# Patient Record
Sex: Male | Born: 1975 | Marital: Single | Smoking: Never smoker
Health system: Southern US, Community
[De-identification: ages and names within clinical notes are randomized; demographics above are authoritative.]

## PROBLEM LIST (undated history)

## (undated) DIAGNOSIS — I10 Essential (primary) hypertension: Secondary | ICD-10-CM

## (undated) DIAGNOSIS — K2900 Acute gastritis without bleeding: Secondary | ICD-10-CM

## (undated) DIAGNOSIS — K219 Gastro-esophageal reflux disease without esophagitis: Secondary | ICD-10-CM

## (undated) DIAGNOSIS — Z87442 Personal history of urinary calculi: Secondary | ICD-10-CM

## (undated) DIAGNOSIS — M5481 Occipital neuralgia: Secondary | ICD-10-CM

## (undated) HISTORY — PX: HIP FRACTURE SURGERY: SHX118

## (undated) HISTORY — PX: CHOLECYSTECTOMY: SHX55

---

## 2004-01-21 HISTORY — PX: CYSTOSCOPY WITH HOLMIUM LASER LITHOTRIPSY: SHX6639

## 2020-11-07 ENCOUNTER — Ambulatory Visit
Admission: RE | Admit: 2020-11-07 | Discharge: 2020-11-07 | Disposition: A | Discharge status: Home / Self Care | Payer: 59 | Attending: Internal Medicine | Admitting: Internal Medicine

## 2020-11-07 ENCOUNTER — Ambulatory Visit: Payer: 59 | Admitting: Anesthesiology

## 2020-11-07 ENCOUNTER — Other Ambulatory Visit: Payer: Self-pay

## 2020-11-07 ENCOUNTER — Encounter: Payer: Self-pay | Admitting: Internal Medicine

## 2020-11-07 ENCOUNTER — Encounter
Admission: RE | Disposition: A | Discharge status: Home / Self Care | Payer: Self-pay | Source: Home / Self Care | Attending: Internal Medicine

## 2020-11-07 DIAGNOSIS — Z1211 Encounter for screening for malignant neoplasm of colon: Secondary | ICD-10-CM | POA: Diagnosis present

## 2020-11-07 DIAGNOSIS — K2270 Barrett's esophagus without dysplasia: Secondary | ICD-10-CM | POA: Insufficient documentation

## 2020-11-07 DIAGNOSIS — K449 Diaphragmatic hernia without obstruction or gangrene: Secondary | ICD-10-CM | POA: Insufficient documentation

## 2020-11-07 DIAGNOSIS — K2289 Other specified disease of esophagus: Secondary | ICD-10-CM | POA: Insufficient documentation

## 2020-11-07 DIAGNOSIS — K219 Gastro-esophageal reflux disease without esophagitis: Secondary | ICD-10-CM | POA: Diagnosis not present

## 2020-11-07 DIAGNOSIS — K2970 Gastritis, unspecified, without bleeding: Secondary | ICD-10-CM | POA: Diagnosis not present

## 2020-11-07 HISTORY — PX: COLONOSCOPY WITH PROPOFOL: SHX5780

## 2020-11-07 HISTORY — DX: Personal history of urinary calculi: Z87.442

## 2020-11-07 HISTORY — PX: ESOPHAGOGASTRODUODENOSCOPY (EGD) WITH PROPOFOL: SHX5813

## 2020-11-07 SURGERY — ESOPHAGOGASTRODUODENOSCOPY (EGD) WITH PROPOFOL
Anesthesia: General

## 2020-11-07 MED ORDER — PROPOFOL 10 MG/ML IV BOLUS
INTRAVENOUS | Status: DC | PRN
  Administered 2020-11-07: 20 mg via INTRAVENOUS
  Administered 2020-11-07: 10 mg via INTRAVENOUS
  Administered 2020-11-07: 20 mg via INTRAVENOUS
  Administered 2020-11-07: 40 mg via INTRAVENOUS

## 2020-11-07 MED ORDER — PROPOFOL 500 MG/50ML IV EMUL
INTRAVENOUS | Status: DC | PRN
  Administered 2020-11-07: 200 ug/kg/min via INTRAVENOUS

## 2020-11-07 MED ORDER — LIDOCAINE HCL (CARDIAC) PF 100 MG/5ML IV SOSY
PREFILLED_SYRINGE | INTRAVENOUS | Status: DC | PRN
  Administered 2020-11-07: 100 mg via INTRAVENOUS

## 2020-11-07 MED ORDER — SODIUM CHLORIDE 0.9 % IV SOLN: INTRAVENOUS | Status: DC

## 2020-11-07 NOTE — Op Note (Signed)
[facility] Gastroenterology Patient Name: Johnathan Massey Procedure Date: 11/07/2020 1:35 PM MRN: [id number] Account #: [id number] Date of Birth: 05/12/1975 Admit Type: Outpatient Age: 45 Room: [facility] ENDO ROOM 2 Gender: Male Note Status: Finalized Instrument Name: Upper Endoscope [id number] Procedure:             Upper GI endoscopy Indications:           Suspected esophageal reflux, Failure to respond to                         medical treatment Providers:             Boykin Nearing. Norma Fredrickson MD, MD Referring MD:          No Local Md, MD (Referring MD) Medicines:             Propofol per Anesthesia Complications:         No immediate complications. Procedure:             Pre-Anesthesia Assessment:                        - The risks and benefits of the procedure and the                         sedation options and risks were discussed with the                         patient. All questions were answered and informed                         consent was obtained.                        - Patient identification and proposed procedure were                         verified prior to the procedure by the nurse. The                         procedure was verified in the procedure room.                        - ASA Grade Assessment: III - A patient with severe                         systemic disease.                        - After reviewing the risks and benefits, the patient                         was deemed in satisfactory condition to undergo the                         procedure.                        After obtaining informed consent, the endoscope was  passed under direct vision. Throughout the procedure,                         the patient's blood pressure, pulse, and oxygen                         saturations were monitored continuously. The Endoscope                         was introduced through the mouth, and advanced to the                          third part of duodenum. The upper GI endoscopy was                         accomplished without difficulty. The patient tolerated                         the procedure well. Findings:      Three tongues of salmon-colored mucosa were present. No other visible       abnormalities were present. The maximum longitudinal extent of these       esophageal mucosal changes was 2 cm in length. Mucosa was biopsied with       a cold forceps for histology. One specimen bottle was sent to pathology.      No other significant abnormalities were identified in a careful       examination of the esophagus.      Two biopsies were obtained in the upper third of the esophagus with cold       forceps for histology.      A 3 cm hiatal hernia was present.      Localized mild inflammation characterized by erythema was found in the       prepyloric region of the stomach.      The examined duodenum was normal.      The exam was otherwise without abnormality. Impression:            - Salmon-colored mucosa suspicious for short-segment                         Barrett's esophagus. Biopsied.                        - 3 cm hiatal hernia.                        - Gastritis.                        - Normal examined duodenum.                        - The examination was otherwise normal.                        - Two biopsies were obtained in the upper third of the                         esophagus. Recommendation:        - Await pathology results.                        -  Proceed with colonoscopy Procedure Code(s):     --- Professional ---                        [phone], Esophagogastroduodenoscopy, flexible,                         transoral; with biopsy, single or multiple Diagnosis Code(s):     --- Professional ---                        K29.70, Gastritis, unspecified, without bleeding                        K44.9, Diaphragmatic hernia without obstruction or                         gangrene                         K22.8, Other specified diseases of esophagus CPT copyright 2019 American Medical Association. All rights reserved. The codes documented in this report are preliminary and upon coder review may  be revised to meet current compliance requirements. Stanton Kidney MD, MD 11/07/2020 2:03:11 PM This report has been signed electronically. Number of Addenda: 0 Note Initiated On: 11/07/2020 1:35 PM Estimated Blood Loss:  Estimated blood loss: none.      [facility]

## 2020-11-07 NOTE — Anesthesia Postprocedure Evaluation (Signed)
Anesthesia Post Note  Patient: Johnathan Massey  Procedure(s) Performed: ESOPHAGOGASTRODUODENOSCOPY (EGD) WITH PROPOFOL COLONOSCOPY WITH PROPOFOL  Patient location during evaluation: Phase II Anesthesia Type: General Level of consciousness: awake and alert, awake and oriented Pain management: pain level controlled Vital Signs Assessment: post-procedure vital signs reviewed and stable Respiratory status: spontaneous breathing, nonlabored ventilation and respiratory function stable Cardiovascular status: blood pressure returned to baseline and stable Postop Assessment: no apparent nausea or vomiting Anesthetic complications: no   No notable events documented.   Last Vitals:  Vitals:   11/07/20 1420 11/07/20 1440  BP: 113/86 (!) 121/98  Pulse: 93   Resp: (!) 22   Temp:    SpO2: 96%     Last Pain:  Vitals:   11/07/20 1450  TempSrc:   PainSc: 0-No pain                 Manfred Arch

## 2020-11-07 NOTE — Op Note (Signed)
[facility] Gastroenterology Patient Name: Johnathan Massey Procedure Date: 11/07/2020 1:35 PM MRN: [id number] Account #: [id number] Date of Birth: 09/02/1975 Admit Type: Outpatient Age: 45 Room: [facility] ENDO ROOM 2 Gender: Male Note Status: Finalized Instrument Name: Nelda Marseille [id number] Procedure:             Colonoscopy Indications:           Screening for colorectal malignant neoplasm Providers:             Royce Macadamia K. Norma Fredrickson MD, MD Referring MD:          No Local Md, MD (Referring MD) Medicines:             Propofol per Anesthesia Complications:         No immediate complications. Procedure:             Pre-Anesthesia Assessment:                        - The risks and benefits of the procedure and the                         sedation options and risks were discussed with the                         patient. All questions were answered and informed                         consent was obtained.                        - Patient identification and proposed procedure were                         verified prior to the procedure by the nurse. The                         procedure was verified in the procedure room.                        - ASA Grade Assessment: II - A patient with mild                         systemic disease.                        - After reviewing the risks and benefits, the patient                         was deemed in satisfactory condition to undergo the                         procedure.                        After obtaining informed consent, the colonoscope was                         passed under direct vision. Throughout the procedure,  the patient's blood pressure, pulse, and oxygen                         saturations were monitored continuously. The                         Colonoscope was introduced through the anus and                         advanced to the the cecum, identified by appendiceal                          orifice and ileocecal valve. The colonoscopy was                         performed without difficulty. The patient tolerated                         the procedure well. The quality of the bowel                         preparation was adequate. The ileocecal valve,                         appendiceal orifice, and rectum were photographed. Findings:      The perianal and digital rectal examinations were normal.      The entire examined colon appeared normal on direct and retroflexion       views. Impression:            - The entire examined colon is normal on direct and                         retroflexion views.                        - No specimens collected. Recommendation:        - Patient has a contact number available for                         emergencies. The signs and symptoms of potential                         delayed complications were discussed with the patient.                         Return to normal activities tomorrow. Written                         discharge instructions were provided to the patient.                        - Await pathology results from EGD, also performed                         today.                        - Resume previous diet.                        -  Continue present medications.                        - Repeat colonoscopy in 10 years for screening                         purposes.                        - Return to physician assistant in 3 months.                        - The findings and recommendations were discussed with                         the patient.                        - Follow up with Jacob Moores, PA-C in the GI office.                         [phone] Procedure Code(s):     --- Professional ---                        N9892, Colorectal cancer screening; colonoscopy on                         individual not meeting criteria for high risk Diagnosis Code(s):     --- Professional ---                        Z12.11,  Encounter for screening for malignant neoplasm                         of colon CPT copyright 2019 American Medical Association. All rights reserved. The codes documented in this report are preliminary and upon coder review may  be revised to meet current compliance requirements. Stanton Kidney MD, MD 11/07/2020 2:23:17 PM This report has been signed electronically. Number of Addenda: 0 Note Initiated On: 11/07/2020 1:35 PM Scope Withdrawal Time: 0 hours 6 minutes 56 seconds  Total Procedure Duration: 0 hours 9 minutes 1 second  Estimated Blood Loss:  Estimated blood loss: none.      [facility]

## 2020-11-07 NOTE — Transfer of Care (Signed)
Immediate Anesthesia Transfer of Care Note  Patient: Johnathan Massey  Procedure(s) Performed: ESOPHAGOGASTRODUODENOSCOPY (EGD) WITH PROPOFOL COLONOSCOPY WITH PROPOFOL  Patient Location: PACU  Anesthesia Type:General  Level of Consciousness: sedated  Airway & Oxygen Therapy: Patient Spontanous Breathing  Post-op Assessment: Report given to RN  Post vital signs: stable  Last Vitals:  Vitals Value Taken Time  BP 113/86 11/07/20 1420  Temp    Pulse 94 11/07/20 1420  Resp 21 11/07/20 1420  SpO2 96 % 11/07/20 1420  Vitals shown include unvalidated device data.  Last Pain:  Vitals:   11/07/20 1420  TempSrc:   PainSc: Asleep         Complications: No notable events documented.

## 2020-11-07 NOTE — Anesthesia Preprocedure Evaluation (Signed)
Anesthesia Evaluation  Patient identified by MRN, date of birth, ID band Patient awake    Reviewed: Allergy & Precautions, NPO status , Patient's Chart, lab work & pertinent test results, reviewed documented beta blocker date and time   Airway Mallampati: II  TM Distance: >3 FB Neck ROM: Full    Dental no notable dental hx.    Pulmonary neg pulmonary ROS,    Pulmonary exam normal        Cardiovascular hypertension, Pt. on medications and Pt. on home beta blockers Normal cardiovascular exam     Neuro/Psych negative neurological ROS  negative psych ROS   GI/Hepatic Neg liver ROS, Bowel prep,GERD  Medicated,  Endo/Other  negative endocrine ROS  Renal/GU negative Renal ROS  negative genitourinary   Musculoskeletal negative musculoskeletal ROS (+)   Abdominal   Peds negative pediatric ROS (+)  Hematology negative hematology ROS (+)   Anesthesia Other Findings . Essential hypertension  . Gastroesophageal reflux disease without esophagitis     Reproductive/Obstetrics negative OB ROS                             Anesthesia Physical Anesthesia Plan  ASA: 2  Anesthesia Plan: General   Post-op Pain Management:    Induction: Intravenous  PONV Risk Score and Plan: 2 and Propofol infusion and TIVA  Airway Management Planned: Natural Airway and Nasal Cannula  Additional Equipment:   Intra-op Plan:   Post-operative Plan:   Informed Consent: I have reviewed the patients History and Physical, chart, labs and discussed the procedure including the risks, benefits and alternatives for the proposed anesthesia with the patient or authorized representative who has indicated his/her understanding and acceptance.       Plan Discussed with: CRNA, Anesthesiologist and Surgeon  Anesthesia Plan Comments:         Anesthesia Quick Evaluation

## 2020-11-08 ENCOUNTER — Encounter: Payer: Self-pay | Admitting: Internal Medicine

## 2020-11-09 LAB — SURGICAL PATHOLOGY

## 2020-11-23 NOTE — H&P (Signed)
Outpatient short stay form Pre-procedure 11/23/2020 3:45 PM Johnathan Massey K. Johnathan Massey, M.D.  Primary Physician: Johnathan Riding, PA-C  Reason for visit:  GERD, colon cancer screening  History of present illness:  Patient presents for EGD for hx of GERD with somewhat decreased response to PPI; colonoscopy for colon cancer screening. The patient denies complaints of abdominal pain, significant change in bowel habits, or rectal bleeding.     No current facility-administered medications for this encounter.  Current Outpatient Medications:    carvedilol (COREG) 6.25 MG tablet, Take 6.25 mg by mouth., Disp: , Rfl:    omeprazole (PRILOSEC) 20 MG capsule, Take 20 mg by mouth 2 (two) times daily before a meal., Disp: , Rfl:   No medications prior to admission.     Not on File   Past Medical History:  Diagnosis Date   History of kidney stones     Review of systems:  Otherwise negative.    Physical Exam  Gen: Alert, oriented. Appears stated age.  HEENT: [place]/AT. PERRLA. Lungs: CTA, no wheezes. CV: RR nl S1, S2. Abd: soft, benign, no masses. BS+ Ext: No edema. Pulses 2+    Planned procedures: Proceed with colonoscopy. The patient understands the nature of the planned procedure, indications, risks, alternatives and potential complications including but not limited to bleeding, infection, perforation, damage to internal organs and possible oversedation/side effects from anesthesia. The patient agrees and gives consent to proceed.  Please refer to procedure notes for findings, recommendations and patient disposition/instructions.     Johnathan Massey K. Johnathan Massey, M.D. Gastroenterology 11/23/2020  3:45 PM

## 2021-01-14 ENCOUNTER — Other Ambulatory Visit: Payer: Self-pay

## 2021-01-14 DIAGNOSIS — R197 Diarrhea, unspecified: Secondary | ICD-10-CM | POA: Insufficient documentation

## 2021-01-14 DIAGNOSIS — N200 Calculus of kidney: Secondary | ICD-10-CM | POA: Diagnosis not present

## 2021-01-14 DIAGNOSIS — I10 Essential (primary) hypertension: Secondary | ICD-10-CM | POA: Insufficient documentation

## 2021-01-14 DIAGNOSIS — R109 Unspecified abdominal pain: Secondary | ICD-10-CM | POA: Diagnosis present

## 2021-01-14 LAB — COMPREHENSIVE METABOLIC PANEL
ALT: 38 U/L (ref 0–44)
AST: 25 U/L (ref 15–41)
Albumin: 4.2 g/dL (ref 3.5–5.0)
Alkaline Phosphatase: 87 U/L (ref 38–126)
Anion gap: 8 (ref 5–15)
BUN: 17 mg/dL (ref 6–20)
CO2: 24 mmol/L (ref 22–32)
Calcium: 9.4 mg/dL (ref 8.9–10.3)
Chloride: 104 mmol/L (ref 98–111)
Creatinine, Ser: 0.99 mg/dL (ref 0.61–1.24)
GFR, Estimated: >60 mL/min (ref >60)
Glucose, Bld: 135 mg/dL — ABNORMAL HIGH (ref 70–99)
Potassium: 3.5 mmol/L (ref 3.5–5.1)
Sodium: 136 mmol/L (ref 135–145)
Total Bilirubin: 1 mg/dL (ref 0.3–1.2)
Total Protein: 8.1 g/dL (ref 6.5–8.1)

## 2021-01-14 LAB — LIPASE, BLOOD: Lipase: 31 U/L (ref 11–51)

## 2021-01-14 LAB — CBC
HCT: 47.5 % (ref 39.0–52.0)
Hemoglobin: 16.4 g/dL (ref 13.0–17.0)
MCH: 30.3 pg (ref 26.0–34.0)
MCHC: 34.5 g/dL (ref 30.0–36.0)
MCV: 87.8 fL (ref 80.0–100.0)
Platelets: 301 10*3/uL (ref 150–400)
RBC: 5.41 MIL/uL (ref 4.22–5.81)
RDW: 12.5 % (ref 11.5–15.5)
WBC: 6.8 10*3/uL (ref 4.0–10.5)
nRBC: 0 % (ref 0.0–0.2)

## 2021-01-14 MED ORDER — HYDROCODONE-ACETAMINOPHEN 5-325 MG PO TABS
1.0000 | ORAL_TABLET | Freq: Once | ORAL | Status: AC
  Administered 2021-01-14: 15:00:00 1 via ORAL
  Filled 2021-01-14: qty 1

## 2021-01-14 NOTE — ED Triage Notes (Signed)
Pt c/o upper abd pain with N/V in the past hour, pt is in NAD.Johnathan Massey

## 2021-01-15 ENCOUNTER — Emergency Department: Payer: 59

## 2021-01-15 ENCOUNTER — Emergency Department
Admission: EM | Admit: 2021-01-15 | Discharge: 2021-01-15 | Disposition: A | Discharge status: Home / Self Care | Payer: 59 | Attending: Emergency Medicine | Admitting: Emergency Medicine

## 2021-01-15 DIAGNOSIS — R1084 Generalized abdominal pain: Secondary | ICD-10-CM

## 2021-01-15 DIAGNOSIS — N200 Calculus of kidney: Secondary | ICD-10-CM

## 2021-01-15 LAB — URINALYSIS, MICROSCOPIC (REFLEX): Bacteria, UA: NONE SEEN

## 2021-01-15 LAB — URINALYSIS, ROUTINE W REFLEX MICROSCOPIC
Bilirubin Urine: NEGATIVE
Glucose, UA: NEGATIVE mg/dL
Ketones, ur: NEGATIVE mg/dL
Leukocytes,Ua: NEGATIVE
Nitrite: NEGATIVE
Protein, ur: 30 mg/dL — AB
Specific Gravity, Urine: >1.03 — ABNORMAL HIGH (ref 1.005–1.030)
pH: 6 (ref 5.0–8.0)

## 2021-01-15 NOTE — ED Notes (Signed)
Pt noted sitting in lobby; u/s notified pt in WR

## 2021-01-15 NOTE — ED Notes (Signed)
No answer when called several times from lobby 

## 2021-01-15 NOTE — ED Provider Notes (Signed)
[facility] Emergency Department Provider Note  ____________________________________________   Event Date/Time   First MD Initiated Contact with Patient 01/15/21 0132     (approximate)  I have reviewed the triage vital signs and the nursing notes.   HISTORY  Chief Complaint Abdominal Pain  The patient and/or family speak(s) Spanish.  They understand they have the right to the use of a hospital interpreter, however at this time they prefer to speak directly with me in Spanish.  They know that they can ask for an interpreter at any time.   HPI Johnathan Massey is a 45 y.o. male who has a prior history of a large left renal calculus requiring surgery about 15 years ago and [place] as well as a history of gallstones status postcholecystectomy.  He presents tonight for a cute onset and severe left-sided abdominal pain that radiated all throughout his abdomen and was associated with nausea, vomiting, and diarrhea.  It felt similar to prior renal colic.  He had a similar episode about 3 months ago but it was much milder and went away on its own.  Pain was severe but got better after he took some diclofenac and received a Percocet in the emergency department.  He has not had any severe pain for approximately 11 hours while waiting to be seen.  He had some dark-colored urine earlier but that seems to have resolved.  No dysuria.  He denies fever, sore throat, chest pain, shortness of breath.  No recent trauma.  The pain was sharp and severe and located as described above.     Past Medical History:  Diagnosis Date   History of kidney stones     There are no problems to display for this patient.   Past Surgical History:  Procedure Laterality Date   CHOLECYSTECTOMY     COLONOSCOPY WITH PROPOFOL N/A 11/07/2020   Procedure: COLONOSCOPY WITH PROPOFOL;  Surgeon: Toledo, Boykin Nearing, MD;  Location: [facility] ENDOSCOPY;  Service: Gastroenterology;  Laterality: N/A;    ESOPHAGOGASTRODUODENOSCOPY (EGD) WITH PROPOFOL N/A 11/07/2020   Procedure: ESOPHAGOGASTRODUODENOSCOPY (EGD) WITH PROPOFOL;  Surgeon: Toledo, Boykin Nearing, MD;  Location: [facility] ENDOSCOPY;  Service: Gastroenterology;  Laterality: N/A;   HIP FRACTURE SURGERY Left     Prior to Admission medications   Medication Sig Start Date End Date Taking? Authorizing Provider  carvedilol (COREG) 6.25 MG tablet Take 6.25 mg by mouth.    [provider]  omeprazole (PRILOSEC) 20 MG capsule Take 20 mg by mouth 2 (two) times daily before a meal.    [provider]    Allergies Patient has no known allergies.  No family history on file.  Social History Social History   Tobacco Use   Smoking status: Never   Smokeless tobacco: Never  Vaping Use   Vaping Use: Never used  Substance Use Topics   Alcohol use: Yes    Comment: occasionally   Drug use: Never    Review of Systems Constitutional: No fever/chills Eyes: No visual changes. ENT: No sore throat. Cardiovascular: Denies chest pain. Respiratory: Denies shortness of breath. Gastrointestinal: Acute onset and severe left-sided abdominal pain that radiated throughout the abdomen and into his back. Genitourinary: Negative for dysuria. Musculoskeletal: Pain as described above radiating either from or to his abdomen and back. Integumentary: Negative for rash. Neurological: Negative for headaches, focal weakness or numbness.  ____________________________________________   PHYSICAL EXAM:  VITAL SIGNS: ED Triage Vitals [01/14/21 1451]  Enc Vitals Group     BP Marland Kitchen)  138/119     Pulse Rate 77     Resp 17     Temp 98.2 F (36.8 C)     Temp Source Oral     SpO2 95 %     Weight 90.7 kg (200 lb)     Height 1.676 m (5\' 6" )     Head Circumference      Peak Flow      Pain Score 9     Pain Loc      Pain Edu?      Excl. in GC?     Constitutional: Alert and oriented.  Eyes: Conjunctivae are normal.  Head: Atraumatic. Nose: No  congestion/rhinnorhea. Mouth/Throat: Patient is wearing a mask. Neck: No stridor.  No meningeal signs.   Cardiovascular: Normal rate, regular rhythm. Good peripheral circulation. Respiratory: Normal respiratory effort.  No retractions. Gastrointestinal: Soft and nontender. No distention.  Musculoskeletal: No lower extremity tenderness nor edema. No gross deformities of extremities. Neurologic:  Normal speech and language. No gross focal neurologic deficits are appreciated.  Skin:  Skin is warm, dry and intact.  Patient has surgical scars from prior cholecystectomy and prior renal surgery on the left side. Psychiatric: Mood and affect are normal. Speech and behavior are normal.  ____________________________________________   LABS (all labs ordered are listed, but only abnormal results are displayed)  Labs Reviewed  COMPREHENSIVE METABOLIC PANEL - Abnormal; Notable for the following components:      Result Value   Glucose, Bld 135 (*)    All other components within normal limits  URINALYSIS, ROUTINE W REFLEX MICROSCOPIC - Abnormal; Notable for the following components:   Specific Gravity, Urine >1.030 (*)    Hgb urine dipstick TRACE (*)    Protein, ur 30 (*)    All other components within normal limits  LIPASE, BLOOD  CBC  URINALYSIS, MICROSCOPIC (REFLEX)   ____________________________________________  EKG  ED ECG REPORT I, , the attending physician, personally viewed and interpreted this ECG.  Date: 01/14/2021 EKG Time: 14: 57 Rate: 78 Rhythm: normal sinus rhythm QRS Axis: normal Intervals: normal ST/T Wave abnormalities: normal Narrative Interpretation: no evidence of acute ischemia  ____________________________________________  RADIOLOGY I, 01/16/2021, personally viewed and evaluated these images (plain radiographs) as part of my medical decision making, as well as reviewing the written report by the radiologist.  ED MD interpretation: Nonobstructing  left renal calculi without hydronephrosis or obstructing stone.  Official radiology report(s): CT Renal Stone Study  Result Date: 01/15/2021 CLINICAL DATA:  Flank pain.  Concern for kidney stone. EXAM: CT ABDOMEN AND PELVIS WITHOUT CONTRAST TECHNIQUE: Multidetector CT imaging of the abdomen and pelvis was performed following the standard protocol without IV contrast. COMPARISON:  None. FINDINGS: Evaluation of this exam is limited in the absence of intravenous contrast. Lower chest: The visualized lung bases are clear. No intra-abdominal free air or free fluid. Hepatobiliary: Fatty liver. No intrahepatic biliary dilatation. Cholecystectomy. Pancreas: Unremarkable. No pancreatic ductal dilatation or surrounding inflammatory changes. Spleen: Normal in size without focal abnormality. Adrenals/Urinary Tract: The adrenal glands unremarkable. Several nonobstructing left renal calculi measure up to 17 mm in the inferior pole of the left kidney. No hydronephrosis. There is no hydronephrosis or nephrolithiasis on the right. The right ureter and the urinary bladder appear unremarkable. Stomach/Bowel: There is diffuse colonic diverticulosis without active inflammatory changes. There is no bowel obstruction or active inflammation. The appendix is unremarkable. Vascular/Lymphatic: The abdominal aorta and IVC unremarkable. No portal venous gas. There is no adenopathy.  Reproductive: The prostate and seminal vesicles are grossly unremarkable. No pelvic mass. Other: Partially visualized small bilateral hydroceles. Musculoskeletal: Left pelvic bone fixation screws. No acute osseous pathology. IMPRESSION: 1. Nonobstructing left renal calculi. No hydronephrosis or obstructing stone. 2. Colonic diverticulosis. No bowel obstruction. Normal appendix. 3. Fatty liver. Electronically Signed   By: Elgie Collard M.D.   On: 01/15/2021 02:22    ____________________________________________   PROCEDURES   Procedure(s) performed  (including Critical Care):  Procedures   ____________________________________________   INITIAL IMPRESSION / MDM / ASSESSMENT AND PLAN / ED COURSE  As part of my medical decision making, I reviewed the following data within the electronic MEDICAL RECORD NUMBER Nursing notes reviewed and incorporated, Labs reviewed , EKG interpreted , Old chart reviewed, Notes from prior ED visits, and [place] Controlled Substance Database   Differential diagnosis includes, but is not limited to, renal/ureteral colic, UTI/pyelonephritis, diverticulitis, less likely AAS.  Vital signs are stable and within normal limits other than hypertension which is likely baseline and not the cause of his symptoms tonight.  EKG is reassuring with no sign of ischemia.  Lipase, CMP, and CBC are all within normal limits.  Physical exam is also reassuring with no significant tenderness to palpation.  However the patient reports having a large left-sided renal calculus requiring surgery in the past and his symptoms could likely be the result of a ureteral stone.  However it is also possible that he had or has diverticulitis even though it is less likely based on limited abdominal pain currently.  I will obtain a CT renal stone protocol for further assessment.  He will likely be appropriate for discharge and outpatient follow-up.     Clinical Course as of 01/15/21 0450  Tue Jan 15, 2021  0340 CT Renal Stone Study Left renal stone without ureteral stone or evidence of obstruction or infection. [CF]  0444 Reassuring urinalysis with some hemoglobinuria but no gross blood and no evidence of infection.  Had an extended conversation with the patient and provided the reassuring results and he is interested in following up with urology.  I provided follow-up information and I gave my usual and customary return precautions.  There is no indication of an acute or emergent medical condition at this time. [CF]    Clinical Course User Index [CF]  Loleta Rose, MD     ____________________________________________  FINAL CLINICAL IMPRESSION(S) / ED DIAGNOSES  Final diagnoses:  Generalized abdominal pain  Kidney stone     MEDICATIONS GIVEN DURING THIS VISIT:  Medications  HYDROcodone-acetaminophen (NORCO/VICODIN) 5-325 MG per tablet 1 tablet (1 tablet Oral Given 01/14/21 1517)     ED Discharge Orders     None        Note:  This document was prepared using Dragon voice recognition software and may include unintentional dictation errors.   Loleta Rose, MD 01/15/21 [phone]

## 2021-07-03 ENCOUNTER — Encounter: Payer: Self-pay | Admitting: *Deleted

## 2021-08-26 ENCOUNTER — Ambulatory Visit
Admission: RE | Admit: 2021-08-26 | Discharge: 2021-08-26 | Disposition: A | Discharge status: Home / Self Care | Payer: 59 | Source: Ambulatory Visit | Attending: Urology | Admitting: Urology

## 2021-08-26 ENCOUNTER — Ambulatory Visit: Payer: 59 | Admitting: Urology

## 2021-08-26 ENCOUNTER — Encounter: Payer: Self-pay | Admitting: Urology

## 2021-08-26 VITALS — BP 136/100 | HR 76 | Ht 69.0 in | Wt 200.0 lb

## 2021-08-26 DIAGNOSIS — N200 Calculus of kidney: Secondary | ICD-10-CM | POA: Diagnosis present

## 2021-08-26 LAB — URINALYSIS, COMPLETE
Bilirubin, UA: NEGATIVE
Glucose, UA: NEGATIVE
Ketones, UA: NEGATIVE
Leukocytes,UA: NEGATIVE
Nitrite, UA: NEGATIVE
Protein,UA: NEGATIVE
Specific Gravity, UA: 1.015 (ref 1.005–1.030)
Urobilinogen, Ur: 0.2 mg/dL (ref 0.2–1.0)
pH, UA: 6 (ref 5.0–7.5)

## 2021-08-26 LAB — MICROSCOPIC EXAMINATION: Bacteria, UA: NONE SEEN

## 2021-08-26 NOTE — Progress Notes (Signed)
08/26/2021 9:37 AM   [facility] 11/27/1975 [id number]  Referring provider: Wilford Corner, PA-C [address] [place],  [place] [address]  Chief Complaint  Patient presents with   Nephrolithiasis    HPI: Johnathan Massey is a 46 y.o. male referred for evaluation of nephrolithiasis.  A [place] Spanish interpreter was present.  ED visit 01/15/2021 with complaints of severe left abdominal pain associated with nausea, vomiting and diarrhea.  Stone protocol CT showed nonobstructing left lower pole renal calculi. At a follow-up visit with PCP June 2023 complained of intermittent left flank pain Prior history stone disease.  States he had removal of a large left renal calculus laparoscopically in [place] in 2006.  No records available Intermittent flank pain has only been mild No bothersome LUTS or gross hematuria   PMH: Past Medical History:  Diagnosis Date   History of kidney stones     Surgical History: Past Surgical History:  Procedure Laterality Date   CHOLECYSTECTOMY     COLONOSCOPY WITH PROPOFOL N/A 11/07/2020   Procedure: COLONOSCOPY WITH PROPOFOL;  Surgeon: Toledo, Boykin Nearing, MD;  Location: [facility] ENDOSCOPY;  Service: Gastroenterology;  Laterality: N/A;   ESOPHAGOGASTRODUODENOSCOPY (EGD) WITH PROPOFOL N/A 11/07/2020   Procedure: ESOPHAGOGASTRODUODENOSCOPY (EGD) WITH PROPOFOL;  Surgeon: Toledo, Boykin Nearing, MD;  Location: [facility] ENDOSCOPY;  Service: Gastroenterology;  Laterality: N/A;   HIP FRACTURE SURGERY Left     Home Medications:  Allergies as of 08/26/2021   No Known Allergies      Medication List        Accurate as of August 26, 2021  9:37 AM. If you have any questions, ask your nurse or doctor.          atorvastatin 20 MG tablet Commonly known as: LIPITOR Take 20 mg by mouth daily.   carvedilol 6.25 MG tablet Commonly known as: COREG Take 6.25 mg by mouth.   omeprazole 20 MG capsule Commonly known as: PRILOSEC Take 20  mg by mouth 2 (two) times daily before a meal.   pantoprazole 40 MG tablet Commonly known as: PROTONIX Take 40 mg by mouth daily.        Allergies: No Known Allergies  Family History: History reviewed. No pertinent family history.  Social History:  reports that he has never smoked. He has never used smokeless tobacco. He reports current alcohol use. He reports that he does not use drugs.   Physical Exam: BP (!) 136/100   Pulse 76   Constitutional:  Alert and oriented, No acute distress. HEENT: [place] AT Respiratory: Normal respiratory effort, no increased work of breathing. GI: Abdomen is soft, nontender, nondistended, no abdominal masses Psychiatric: Normal mood and affect.  Laboratory Data:  Urinalysis Dipstick/microscopy negative   Pertinent Imaging: CT images from December 2022 were personally reviewed and interpreted.  Nonobstructing left lower pole calculus measuring ~ 15 mm.  Density 1300-1400 HU  CT Renal Stone Study  Narrative CLINICAL DATA:  Flank pain.  Concern for kidney stone.  EXAM: CT ABDOMEN AND PELVIS WITHOUT CONTRAST  TECHNIQUE: Multidetector CT imaging of the abdomen and pelvis was performed following the standard protocol without IV contrast.  COMPARISON:  None.  FINDINGS: Evaluation of this exam is limited in the absence of intravenous contrast.  Lower chest: The visualized lung bases are clear.  No intra-abdominal free air or free fluid.  Hepatobiliary: Fatty liver. No intrahepatic biliary dilatation. Cholecystectomy.  Pancreas: Unremarkable. No pancreatic ductal dilatation or surrounding inflammatory changes.  Spleen: Normal in size  without focal abnormality.  Adrenals/Urinary Tract: The adrenal glands unremarkable. Several nonobstructing left renal calculi measure up to 17 mm in the inferior pole of the left kidney. No hydronephrosis. There is no hydronephrosis or nephrolithiasis on the right. The right ureter and the urinary  bladder appear unremarkable.  Stomach/Bowel: There is diffuse colonic diverticulosis without active inflammatory changes. There is no bowel obstruction or active inflammation. The appendix is unremarkable.  Vascular/Lymphatic: The abdominal aorta and IVC unremarkable. No portal venous gas. There is no adenopathy.  Reproductive: The prostate and seminal vesicles are grossly unremarkable. No pelvic mass.  Other: Partially visualized small bilateral hydroceles.  Musculoskeletal: Left pelvic bone fixation screws. No acute osseous pathology.  IMPRESSION: 1. Nonobstructing left renal calculi. No hydronephrosis or obstructing stone. 2. Colonic diverticulosis. No bowel obstruction. Normal appendix. 3. Fatty liver.   Electronically Signed By: Elgie Collard M.D. On: 01/15/2021 02:22   Assessment & Plan:    1.  Left nephrolithiasis Nonobstructing left lower pole calculus December 2022 Minimally symptomatic Recommend KUB and treatment for interval stone growth If no stone growth we discussed options of surveillance versus ureteroscopy.  The procedure was discussed.  Would not recommend SWL with lower pole location and density >1000 HU All questions were answered.  He will be notified with the KUB results    Riki Altes, MD  [facility] [address] [place], [place] [address] [phone]

## 2021-08-28 ENCOUNTER — Telehealth: Payer: Self-pay | Admitting: *Deleted

## 2021-08-28 NOTE — Telephone Encounter (Signed)
Called the patient with interpreter no answer she left a message

## 2021-08-28 NOTE — Telephone Encounter (Signed)
-----   Message from Riki Altes, MD sent at 08/27/2021  9:21 PM EDT ----- Johnathan Massey has increased slightly in size from prior CT scan.  It is in a nonobstructing position.  We discussed at the office visit yesterday the stone could be treated with ureteroscopy or can be monitored.  If he elects surveillance would recommend a 84-month follow-up office visit with KUB.  (Spanish interpreter)

## 2021-08-29 NOTE — Telephone Encounter (Signed)
Johnathan Massey spoke with patient and he will call back and let us know what he would like to do .

## 2021-09-26 NOTE — Telephone Encounter (Signed)
Patient called via interpreter services, patient would like to proceed with Ureteroscopy as soon as possible. Informed he will receive a call from scheduled and information will be reviewed with him. Voiced understanding.

## 2021-09-29 ENCOUNTER — Other Ambulatory Visit: Payer: Self-pay | Admitting: Urology

## 2021-09-29 DIAGNOSIS — N200 Calculus of kidney: Secondary | ICD-10-CM

## 2021-09-29 NOTE — Progress Notes (Unsigned)
Surgical Physician Order Form [facility]  * Scheduling expectation : Next Available  *Length of Case: 90 min  *Clearance needed: no  *Anticoagulation Instructions: N/A  *Aspirin Instructions: N/A  *Post-op visit Date/Instructions:  1 week cysto stent removal  *Diagnosis: Left Nephrolithiasis  *Procedure:  Left  Ureteroscopy w/laser lithotripsy & stent placement (47340)   Additional orders: N/A  -Admit type: OUTpatient  -Anesthesia: General  -VTE Prophylaxis Standing Order SCD's       Other:   -Standing Lab Orders Per Anesthesia    Lab other: UA&Urine Culture  -Standing Test orders EKG/Chest x-ray per Anesthesia       Test other:   - Medications:  Ancef 2gm IV  -Other orders:  N/A

## 2021-10-01 ENCOUNTER — Other Ambulatory Visit: Payer: Self-pay

## 2021-10-01 ENCOUNTER — Other Ambulatory Visit: Payer: 59

## 2021-10-01 ENCOUNTER — Telehealth: Payer: Self-pay

## 2021-10-01 DIAGNOSIS — N200 Calculus of kidney: Secondary | ICD-10-CM

## 2021-10-01 LAB — URINALYSIS, COMPLETE
Bilirubin, UA: NEGATIVE
Glucose, UA: NEGATIVE
Ketones, UA: NEGATIVE
Leukocytes,UA: NEGATIVE
Nitrite, UA: NEGATIVE
Protein,UA: NEGATIVE
RBC, UA: NEGATIVE
Specific Gravity, UA: 1.015 (ref 1.005–1.030)
Urobilinogen, Ur: 0.2 mg/dL (ref 0.2–1.0)
pH, UA: 7 (ref 5.0–7.5)

## 2021-10-01 LAB — MICROSCOPIC EXAMINATION
Bacteria, UA: NONE SEEN
RBC, Urine: NONE SEEN /hpf (ref 0–2)

## 2021-10-01 NOTE — Telephone Encounter (Signed)
I spoke with Mr. Johnathan Massey. We have discussed possible surgery dates and Tuesday September 19th, 2023 was agreed upon by all parties. Patient given information about surgery date, what to expect pre-operatively and post operatively.  We discussed that a Pre-Admission Testing office will be calling to set up the pre-op visit that will take place prior to surgery, and that these appointments are typically done over the phone with a Pre-Admissions RN.  Informed patient that our office will communicate any additional care to be provided after surgery. Patients questions or concerns were discussed during our call. Advised to call our office should there be any additional information, questions or concerns that arise. Patient verbalized understanding.

## 2021-10-01 NOTE — Progress Notes (Signed)
[facility] Posting Form   Surgery Date/Time: Date: 10/08/2021  Surgeon: Dr. Irineo Axon, MD  Surgery Location: Day Surgery  Inpt ( No  )   Outpt (Yes)   Obs ( No  )   Diagnosis: N20.0 Left Nephrolithiasis  -CPT: 29476  Surgery: Left Ureteroscopy with laser lithotripsy and stent placement  Stop Anticoagulations: N/A  Cardiac/Medical/Pulmonary Clearance needed: no  *Orders entered into EPIC  Date: 10/01/21   *Case booked in EPIC  Date: 10/01/21  *Notified pt of Surgery: Date: 10/01/21  PRE-OP UA & CX: yes, obtained in clinic today 10/01/2021  *Placed into Prior Authorization Work Angela Nevin Date: 10/01/21   Assistant/laser/rep:No

## 2021-10-01 NOTE — Telephone Encounter (Signed)
Left message with the intrepreter service to have patient return my call to schedule surgery.

## 2021-10-04 ENCOUNTER — Encounter
Admission: RE | Admit: 2021-10-04 | Discharge: 2021-10-04 | Disposition: A | Discharge status: Home / Self Care | Payer: 59 | Source: Ambulatory Visit | Attending: Urology | Admitting: Urology

## 2021-10-04 ENCOUNTER — Other Ambulatory Visit: Payer: Self-pay

## 2021-10-04 HISTORY — DX: Acute gastritis without bleeding: K29.00

## 2021-10-04 HISTORY — DX: Occipital neuralgia: M54.81

## 2021-10-04 HISTORY — DX: Essential (primary) hypertension: I10

## 2021-10-04 HISTORY — DX: Gastro-esophageal reflux disease without esophagitis: K21.9

## 2021-10-04 LAB — CULTURE, URINE COMPREHENSIVE

## 2021-10-04 NOTE — Pre-Procedure Instructions (Addendum)
Attempted to call patient for his scheduled PAT appt today using Spanish interpreter, ID # [id number], message was left on patients voicemail and a message was left on his alternate contacts voicemail as well. Attempted to call patient at 69 using Spanish interpreter ID # [phone], message left on voicemail. Call completed with Spanish interpreter ID # [id number], all questions answered and a copy of instructions in spanish was securely sent to patient via email.

## 2021-10-04 NOTE — Patient Instructions (Addendum)
Your procedure is scheduled on: 10/08/21 - Tuesday Report to the Registration Desk on the 1st floor of the [facility]. To find out your arrival time, please call [phone] between 1PM - 3PM on: 10/07/21 - Monday If your arrival time is 6:00 am, do not arrive prior to that time as the [facility] entrance doors do not open until 6:00 am.  REMEMBER: Instructions that are not followed completely may result in serious medical risk, up to and including death; or upon the discretion of your surgeon and anesthesiologist your surgery may need to be rescheduled.  Do not eat food or drink any fluids after midnight the night before surgery.  No gum chewing, lozengers or hard candies.  TAKE THESE MEDICATIONS THE MORNING OF SURGERY WITH A SIP OF WATER:  - carvedilol (COREG)  - NIFEdipine (ADALAT) - pantoprazole (PROTONIX) (take one the night before and one on the morning of surgery - helps to prevent nausea after surgery.) - atorvastatin (LIPITOR)    One week prior to surgery: Stop Anti-inflammatories (NSAIDS) such as Advil, Aleve, Ibuprofen, Motrin, Naproxen, Naprosyn and Aspirin based products such as Excedrin, Goodys Powder, BC Powder.  Stop ANY OVER THE COUNTER supplements until after surgery.  You may however, continue to take Tylenol if needed for pain up until the day of surgery.  No Alcohol for 24 hours before or after surgery.  No Smoking including e-cigarettes for 24 hours prior to surgery.  No chewable tobacco products for at least 6 hours prior to surgery.  No nicotine patches on the day of surgery.  Do not use any "recreational" drugs for at least a week prior to your surgery.  Please be advised that the combination of cocaine and anesthesia may have negative outcomes, up to and including death. If you test positive for cocaine, your surgery will be cancelled.  On the morning of surgery brush your teeth with toothpaste and water, you may rinse your mouth with mouthwash if  you wish. Do not swallow any toothpaste or mouthwash.  Do not wear jewelry, make-up, hairpins, clips or nail polish.  Do not wear lotions, powders, or perfumes.   Do not shave body from the neck down 48 hours prior to surgery just in case you cut yourself which could leave a site for infection.  Also, freshly shaved skin may become irritated if using the CHG soap.  Contact lenses, hearing aids and dentures may not be worn into surgery.  Do not bring valuables to the hospital. [facility] is not responsible for any missing/lost belongings or valuables.   Notify your doctor if there is any change in your medical condition (cold, fever, infection).  Wear comfortable clothing (specific to your surgery type) to the hospital.  After surgery, you can help prevent lung complications by doing breathing exercises.  Take deep breaths and cough every 1-2 hours. Your doctor may order a device called an Incentive Spirometer to help you take deep breaths. When coughing or sneezing, hold a pillow firmly against your incision with both hands. This is called "splinting." Doing this helps protect your incision. It also decreases belly discomfort.  If you are being admitted to the hospital overnight, leave your suitcase in the car. After surgery it may be brought to your room.  If you are being discharged the day of surgery, you will not be allowed to drive home. You will need a responsible adult (18 years or older) to drive you home and stay with you that night.  If you are taking public transportation, you will need to have a responsible adult (18 years or older) with you. Please confirm with your physician that it is acceptable to use public transportation.   Please call the Pre-admissions Testing Dept. at [phone] if you have any questions about these instructions.  Surgery Visitation Policy:  Patients undergoing a surgery or procedure may have two family members or support persons with  them as long as the person is not COVID-19 positive or experiencing its symptoms.   Inpatient Visitation:    Visiting hours are 7 a.m. to 8 p.m. Up to four visitors are allowed at one time in a patient room, including children. The visitors may rotate out with other people during the day. One designated support person (adult) may remain overnight.

## 2021-10-08 ENCOUNTER — Ambulatory Visit: Payer: 59 | Admitting: Anesthesiology

## 2021-10-08 ENCOUNTER — Encounter
Admission: RE | Disposition: A | Discharge status: Home / Self Care | Payer: Self-pay | Source: Home / Self Care | Attending: Urology

## 2021-10-08 ENCOUNTER — Encounter: Payer: Self-pay | Admitting: Urology

## 2021-10-08 ENCOUNTER — Ambulatory Visit: Payer: 59

## 2021-10-08 ENCOUNTER — Ambulatory Visit
Admission: RE | Admit: 2021-10-08 | Discharge: 2021-10-08 | Disposition: A | Discharge status: Home / Self Care | Payer: 59 | Attending: Urology | Admitting: Urology

## 2021-10-08 ENCOUNTER — Other Ambulatory Visit: Payer: Self-pay

## 2021-10-08 DIAGNOSIS — I10 Essential (primary) hypertension: Secondary | ICD-10-CM | POA: Insufficient documentation

## 2021-10-08 DIAGNOSIS — N200 Calculus of kidney: Secondary | ICD-10-CM | POA: Insufficient documentation

## 2021-10-08 DIAGNOSIS — K219 Gastro-esophageal reflux disease without esophagitis: Secondary | ICD-10-CM | POA: Diagnosis not present

## 2021-10-08 HISTORY — PX: CYSTOSCOPY/URETEROSCOPY/HOLMIUM LASER/STENT PLACEMENT: SHX6546

## 2021-10-08 SURGERY — CYSTOSCOPY/URETEROSCOPY/HOLMIUM LASER/STENT PLACEMENT
Anesthesia: General | Site: Ureter | Laterality: Left

## 2021-10-08 MED ORDER — IOHEXOL 180 MG/ML  SOLN
INTRAMUSCULAR | Status: DC | PRN
  Administered 2021-10-08 (×2): 10 mL

## 2021-10-08 MED ORDER — PHENYLEPHRINE HCL (PRESSORS) 10 MG/ML IV SOLN
INTRAVENOUS | Status: DC | PRN
  Administered 2021-10-08 (×2): 120 ug via INTRAVENOUS
  Administered 2021-10-08: 160 ug via INTRAVENOUS
  Administered 2021-10-08: 80 ug via INTRAVENOUS
  Administered 2021-10-08: 160 ug via INTRAVENOUS

## 2021-10-08 MED ORDER — MIDAZOLAM HCL 2 MG/2ML IJ SOLN
INTRAMUSCULAR | Status: DC | PRN
  Administered 2021-10-08: 2 mg via INTRAVENOUS

## 2021-10-08 MED ORDER — CEFAZOLIN SODIUM-DEXTROSE 2-4 GM/100ML-% IV SOLN
INTRAVENOUS | Status: AC
  Filled 2021-10-08: qty 100

## 2021-10-08 MED ORDER — OXYCODONE HCL 5 MG/5ML PO SOLN: 5.0000 mg | Freq: Once | ORAL | Status: DC | PRN

## 2021-10-08 MED ORDER — OXYBUTYNIN CHLORIDE 5 MG PO TABS: ORAL_TABLET | ORAL | 0 refills | Status: AC

## 2021-10-08 MED ORDER — ORAL CARE MOUTH RINSE: 15.0000 mL | Freq: Once | OROMUCOSAL | Status: AC

## 2021-10-08 MED ORDER — FENTANYL CITRATE (PF) 100 MCG/2ML IJ SOLN
INTRAMUSCULAR | Status: DC | PRN
  Administered 2021-10-08 (×2): 50 ug via INTRAVENOUS

## 2021-10-08 MED ORDER — LIDOCAINE HCL (CARDIAC) PF 100 MG/5ML IV SOSY
PREFILLED_SYRINGE | INTRAVENOUS | Status: DC | PRN
  Administered 2021-10-08: 100 mg via INTRAVENOUS

## 2021-10-08 MED ORDER — ONDANSETRON HCL 4 MG/2ML IJ SOLN: 4.0000 mg | Freq: Once | INTRAMUSCULAR | Status: DC | PRN

## 2021-10-08 MED ORDER — SODIUM CHLORIDE 0.9 % IR SOLN
Status: DC | PRN
  Administered 2021-10-08: 3000 mL via INTRAVESICAL

## 2021-10-08 MED ORDER — SUGAMMADEX SODIUM 200 MG/2ML IV SOLN
INTRAVENOUS | Status: DC | PRN
  Administered 2021-10-08: 200 mg via INTRAVENOUS

## 2021-10-08 MED ORDER — MIDAZOLAM HCL 2 MG/2ML IJ SOLN
INTRAMUSCULAR | Status: AC
  Filled 2021-10-08: qty 2

## 2021-10-08 MED ORDER — ACETAMINOPHEN 10 MG/ML IV SOLN: 1000.0000 mg | Freq: Once | INTRAVENOUS | Status: DC | PRN

## 2021-10-08 MED ORDER — LACTATED RINGERS IV SOLN: INTRAVENOUS | Status: DC

## 2021-10-08 MED ORDER — OXYCODONE HCL 5 MG PO TABS: 5.0000 mg | ORAL_TABLET | Freq: Once | ORAL | Status: DC | PRN

## 2021-10-08 MED ORDER — FENTANYL CITRATE (PF) 100 MCG/2ML IJ SOLN: 25.0000 ug | INTRAMUSCULAR | Status: DC | PRN

## 2021-10-08 MED ORDER — PROPOFOL 10 MG/ML IV BOLUS
INTRAVENOUS | Status: DC | PRN
  Administered 2021-10-08: 160 mg via INTRAVENOUS

## 2021-10-08 MED ORDER — FENTANYL CITRATE (PF) 100 MCG/2ML IJ SOLN
INTRAMUSCULAR | Status: AC
  Filled 2021-10-08: qty 2

## 2021-10-08 MED ORDER — ROCURONIUM BROMIDE 100 MG/10ML IV SOLN
INTRAVENOUS | Status: DC | PRN
  Administered 2021-10-08: 50 mg via INTRAVENOUS
  Administered 2021-10-08: 10 mg via INTRAVENOUS

## 2021-10-08 MED ORDER — ACETAMINOPHEN 10 MG/ML IV SOLN
INTRAVENOUS | Status: DC | PRN
  Administered 2021-10-08: 1000 mg via INTRAVENOUS

## 2021-10-08 MED ORDER — CHLORHEXIDINE GLUCONATE 0.12 % MT SOLN: 15.0000 mL | Freq: Once | OROMUCOSAL | Status: AC

## 2021-10-08 MED ORDER — TAMSULOSIN HCL 0.4 MG PO CAPS: 0.4000 mg | ORAL_CAPSULE | Freq: Every day | ORAL | 0 refills | Status: AC

## 2021-10-08 MED ORDER — HYDROCODONE-ACETAMINOPHEN 5-325 MG PO TABS: 1.0000 | ORAL_TABLET | Freq: Four times a day (QID) | ORAL | 0 refills | Status: AC | PRN

## 2021-10-08 MED ORDER — CEFAZOLIN SODIUM-DEXTROSE 2-4 GM/100ML-% IV SOLN
2.0000 g | INTRAVENOUS | Status: AC
  Administered 2021-10-08: 2 g via INTRAVENOUS

## 2021-10-08 MED ORDER — DEXAMETHASONE SODIUM PHOSPHATE 10 MG/ML IJ SOLN
INTRAMUSCULAR | Status: DC | PRN
  Administered 2021-10-08: 8 mg via INTRAVENOUS

## 2021-10-08 MED ORDER — PHENYLEPHRINE 80 MCG/ML (10ML) SYRINGE FOR IV PUSH (FOR BLOOD PRESSURE SUPPORT)
PREFILLED_SYRINGE | INTRAVENOUS | Status: AC
  Filled 2021-10-08: qty 30

## 2021-10-08 MED ORDER — ACETAMINOPHEN 10 MG/ML IV SOLN
INTRAVENOUS | Status: AC
  Filled 2021-10-08: qty 100

## 2021-10-08 MED ORDER — KETOROLAC TROMETHAMINE 30 MG/ML IJ SOLN
INTRAMUSCULAR | Status: DC | PRN
  Administered 2021-10-08: 30 mg via INTRAVENOUS

## 2021-10-08 MED ORDER — CHLORHEXIDINE GLUCONATE 0.12 % MT SOLN
OROMUCOSAL | Status: AC
  Administered 2021-10-08: 15 mL via OROMUCOSAL
  Filled 2021-10-08: qty 15

## 2021-10-08 MED ORDER — ONDANSETRON HCL 4 MG/2ML IJ SOLN
INTRAMUSCULAR | Status: DC | PRN
  Administered 2021-10-08: 4 mg via INTRAVENOUS

## 2021-10-08 SURGICAL SUPPLY — 28 items
BAG DRAIN SIEMENS DORNER NS (MISCELLANEOUS) ×1 IMPLANT
BASKET LASER NITINOL 1.9FR (BASKET) IMPLANT
BASKET ZERO TIP 1.9FR (BASKET) IMPLANT
BRUSH SCRUB EZ 1% IODOPHOR (MISCELLANEOUS) ×1 IMPLANT
CATH URET FLEX-TIP 2 LUMEN 10F (CATHETERS) IMPLANT
CATH URETL OPEN END 6X70 (CATHETERS) IMPLANT
CNTNR SPEC 2.5X3XGRAD LEK (MISCELLANEOUS)
CONT SPEC 4OZ STER OR WHT (MISCELLANEOUS)
CONTAINER SPEC 2.5X3XGRAD LEK (MISCELLANEOUS) IMPLANT
DRAPE UTILITY 15X26 TOWEL STRL (DRAPES) ×1 IMPLANT
FIBER LASER MOSES 200 DFL (Laser) IMPLANT
GLOVE SURG UNDER POLY LF SZ7.5 (GLOVE) ×1 IMPLANT
GOWN STRL REUS W/ TWL LRG LVL3 (GOWN DISPOSABLE) ×1 IMPLANT
GOWN STRL REUS W/ TWL XL LVL3 (GOWN DISPOSABLE) ×1 IMPLANT
GOWN STRL REUS W/TWL LRG LVL3 (GOWN DISPOSABLE) ×1
GOWN STRL REUS W/TWL XL LVL3 (GOWN DISPOSABLE) ×1
GUIDEWIRE STR DUAL SENSOR (WIRE) ×1 IMPLANT
IV NS IRRIG 3000ML ARTHROMATIC (IV SOLUTION) ×1 IMPLANT
KIT TURNOVER CYSTO (KITS) ×1 IMPLANT
PACK CYSTO AR (MISCELLANEOUS) ×1 IMPLANT
SET CYSTO W/LG BORE CLAMP LF (SET/KITS/TRAYS/PACK) ×1 IMPLANT
SHEATH NAVIGATOR HD 12/14X36 (SHEATH) IMPLANT
STENT URET 6FRX24 CONTOUR (STENTS) IMPLANT
STENT URET 6FRX26 CONTOUR (STENTS) IMPLANT
SURGILUBE 2OZ TUBE FLIPTOP (MISCELLANEOUS) ×1 IMPLANT
TRAP FLUID SMOKE EVACUATOR (MISCELLANEOUS) ×1 IMPLANT
VALVE UROSEAL ADJ ENDO (VALVE) IMPLANT
WATER STERILE IRR 500ML POUR (IV SOLUTION) ×1 IMPLANT

## 2021-10-08 NOTE — Op Note (Signed)
Preoperative diagnosis:  Left nephrolithiasis   Postoperative diagnosis:  Left nephrolithiasis  Procedure:  Cystoscopy Left ureteroscopy and stone removal Ureteroscopic laser lithotripsy Left ureteral stent placement (F/24 cm)  Left retrograde pyelography with interpretation Intraoperative fluoroscopy < 30 minutes  Surgeon: Lorin Picket C. Jeriah Corkum, M.D.  Anesthesia: General  Complications: None  Intraoperative findings:  Cystoscopy-urethra normal in caliber without stricture.  Prostate with mild lateral lobe enlargement.  Bladder mucosa without erythema, solid or papillary lesions.  UOs normal-appearing bilaterally Left ureteropyeloscopy-ureter normal in appearance.  No stricture, lesion or calculi.   < 1 cm upper calyceal calculus.  The lower calyceal calculus branched into 2 infundibuli Left retrograde pyelogram remarkable for a upper calyceal calculus and lower calyceal calculus; left retrograde pyelography post procedure showed no contrast extravasation  EBL: Minimal  Specimens: Calculus fragments for analysis   Indication: Johnathan Massey is a 46 y.o. male with a 19 mm nonobstructing left lower pole calculus.  Prior history of treatment of a large left renal calculus in [place] in 2006.  He requested ureteroscopic stone removal after discussing management options including surveillance.  After reviewing the management options for treatment, the patient elected to proceed with the above surgical procedure(s). We have discussed the potential benefits and risks of the procedure, side effects of the proposed treatment, the likelihood of the patient achieving the goals of the procedure, and any potential problems that might occur during the procedure or recuperation. Informed consent has been obtained.  Description of procedure:  The patient was taken to the operating room and general anesthesia was induced.  The patient was placed in the dorsal lithotomy position, prepped and  draped in the usual sterile fashion, and preoperative antibiotics were administered. A preoperative time-out was performed.   A 21 French cystoscope was lubricated, passed per urethra and advanced into the bladder under direct vision with findings as described above.  Attention was directed to the left ureteral orifice and a 0.038 Sensor wire was then advanced up the ureter into the renal pelvis under fluoroscopic guidance.  The cystoscope was removed and a dual-lumen catheter was placed over the Sensor wire.  Retrograde pyelogram was performed with findings as described above.  The dual-lumen catheter was removed.  A single channel digital flexible ureteroscope was passed per urethra and the scope was advanced alongside the guidewire into the renal pelvis with findings as described above.  Attention was directed to the lower calyceal calculus.  A 200 m Moses holmium laser fiber was placed through the ureteroscope.  The fiber would not advance through the flexed scope in the lower calyx.  It was able to be advanced with the straightened ureteroscope then flexed and advanced into the calyx.  The lower calyceal calculus was dusted at initial setting of 0.3J/80 Hz and subsequently increased to 0.3J/120 Hz.  The lateral extension of the stone could not be treated as the calculus could not be visualized.  Attempts were made to relocate this fragment with an Escape basket however were unsuccessful.  A second fragment migrated to an adjacent posterior calyx which the scope was unable to be advanced into.  This fragment was able to be placed in the Escape basket and relocated to an upper pole calyx where it was further dusted at a setting of 0.3J/40 Hz.  The upper calyceal calculus was also dusted at 0.3J/40 Hz.  Retrograde pyelograms and performed through the ureteroscope and all calyces were examined.  A small fragment of the upper calyceal stone was the only  significantly sized fragment identified and was placed  in the basket and removed.  A 16F/24 cm Contour ureteral stent was then placed over the guidewire under fluoroscopic guidance.  Good positioning was noted in the bladder under fluoroscopy.  The proximal tip was in an upper pole calyx.  A cystoscope sheath was advanced in the bladder which was emptied.  After anesthetic reversal patient was transported the PACU in stable condition.  Plan: He will tentatively follow-up 10/17/2021 for stent removal.  A KUB will be obtained prior to stent removal Based on the volume of the lower pole calculus remaining options of surveillance versus SWL will be discussed  Johnathan Giovanni, MD

## 2021-10-08 NOTE — Anesthesia Postprocedure Evaluation (Signed)
Anesthesia Post Note  Patient: Johnathan Massey  Procedure(s) Performed: CYSTOSCOPY/URETEROSCOPY/HOLMIUM LASER/STENT PLACEMENT (Left: Ureter)  Patient location during evaluation: PACU Anesthesia Type: General Level of consciousness: awake and alert Pain management: pain level controlled Vital Signs Assessment: post-procedure vital signs reviewed and stable Respiratory status: spontaneous breathing, nonlabored ventilation, respiratory function stable and patient connected to nasal cannula oxygen Cardiovascular status: blood pressure returned to baseline and stable Postop Assessment: no apparent nausea or vomiting Anesthetic complications: no   No notable events documented.   Last Vitals:  Vitals:   10/08/21 1734 10/08/21 1739  BP: (!) 130/102 (!) 125/96  Pulse: 86   Resp: 16   Temp: 36.5 C   SpO2: 99%     Last Pain:  Vitals:   10/08/21 1734  TempSrc: Temporal  PainSc: 0-No pain                 Molli Barrows

## 2021-10-08 NOTE — H&P (Signed)
Urology H&P   Chief Complaint: Kidney stone  History of Present Illness: Johnathan Massey is a 46 y.o. male with history of removal of a large left renal calculus in [place] in 2006.  CT performed December 2022 remarkable for nonobstructing left lower pole calculus.  He complains of intermittent flank pain.  KUB performed last month showed slight interval growth.  He has requested ureteroscopic removal.  Past Medical History:  Diagnosis Date   Acute gastritis without hemorrhage    GERD (gastroesophageal reflux disease)    History of kidney stones    Hypertension    Occipital neuralgia of right side     Past Surgical History:  Procedure Laterality Date   CHOLECYSTECTOMY     COLONOSCOPY WITH PROPOFOL N/A 11/07/2020   Procedure: COLONOSCOPY WITH PROPOFOL;  Surgeon: Toledo, Benay Pike, MD;  Location: [facility] ENDOSCOPY;  Service: Gastroenterology;  Laterality: N/A;   CYSTOSCOPY WITH HOLMIUM LASER LITHOTRIPSY  2006   ESOPHAGOGASTRODUODENOSCOPY (EGD) WITH PROPOFOL N/A 11/07/2020   Procedure: ESOPHAGOGASTRODUODENOSCOPY (EGD) WITH PROPOFOL;  Surgeon: Toledo, Benay Pike, MD;  Location: [facility] ENDOSCOPY;  Service: Gastroenterology;  Laterality: N/A;   HIP FRACTURE SURGERY Left     Home Medications:  Current Meds  Medication Sig   atorvastatin (LIPITOR) 20 MG tablet Take 20 mg by mouth daily.   carvedilol (COREG) 6.25 MG tablet Take 6.25 mg by mouth.   NIFEdipine (ADALAT CC) 30 MG 24 hr tablet Take 30 mg by mouth daily.   pantoprazole (PROTONIX) 40 MG tablet Take 40 mg by mouth daily.   pregabalin (LYRICA) 25 MG capsule Take 25 mg by mouth 2 (two) times daily.    Allergies: No Known Allergies  History reviewed. No pertinent family history.  Social History:  reports that he has never smoked. He has never used smokeless tobacco. He reports current alcohol use. He reports that he does not use drugs.  ROS: A complete review of systems was performed.  All systems are negative except for  pertinent findings as noted.  Physical Exam:  Vital signs in last 24 hours: Temp:  [97.8 F (36.6 C)] 97.8 F (36.6 C) (09/19 1251) Pulse Rate:  [89] 89 (09/19 1251) Resp:  [20] 20 (09/19 1251) BP: (141)/(108) 141/108 (09/19 1251) SpO2:  [97 %] 97 % (09/19 1251) Weight:  [90.7 kg] 90.7 kg (09/19 1251) Constitutional:  Alert and oriented, No acute distress HEENT: [place] AT, moist mucus membranes.  Trachea midline, no masses Cardiovascular: Regular rate and rhythm Respiratory: Normal respiratory effort, lungs clear bilaterally  Laboratory Data:  No results for input(s): "WBC", "HGB", "HCT" in the last 72 hours. No results for input(s): "NA", "K", "CL", "CO2", "GLUCOSE", "BUN", "CREATININE", "CALCIUM" in the last 72 hours. No results for input(s): "LABPT", "INR" in the last 72 hours. No results for input(s): "LABURIN" in the last 72 hours. Results for orders placed or performed in visit on 10/01/21  CULTURE, URINE COMPREHENSIVE     Status: None   Collection Time: 10/01/21 11:39 AM   Specimen: Urine   UR  Result Value Ref Range Status   Urine Culture, Comprehensive Final report  Final   Organism ID, Bacteria Comment  Final    Comment: No growth in 36 - 48 hours.  Microscopic Examination     Status: None   Collection Time: 10/01/21 11:39 AM   Urine  Result Value Ref Range Status   WBC, UA 0-5 0 - 5 /hpf Final   RBC, Urine None seen 0 - 2 /hpf  Final   Epithelial Cells (non renal) 0-10 0 - 10 /hpf Final   Bacteria, UA None seen None seen/Few Final     Impression/Plan:  1.  Left nephrolithiasis Large nonobstructing left lower pole renal calculus He has requested ureteroscopic removal The procedure was discussed in detail via a Johnston the interpreter.  We discussed the possibility of requiring a staged procedure though the goal would be to treat the entire stone and a single session.  The small chance of inability to access the upper urinary tract with ureteroscope was  discussed and if this were to occur he would need a stent placed for approximately 2 weeks with a follow-up ureteroscopy.  Potential risks were discussed including bleeding, infection, ureteral/renal injury.  The need for a postoperative ureteral stent was discussed including possibility of stent symptoms.  All questions were answered and he desires to proceed    10/08/2021, 2:05 PM  [name],  MD

## 2021-10-08 NOTE — Interval H&P Note (Signed)
History and Physical Interval Note:  10/08/2021 2:11 PM  Johnathan Massey  has presented today for surgery, with the diagnosis of Left Nephrolithiasis.  The various methods of treatment have been discussed with the patient and family. After consideration of risks, benefits and other options for treatment, the patient has consented to  Procedure(s): CYSTOSCOPY/URETEROSCOPY/HOLMIUM LASER/STENT PLACEMENT (Left) as a surgical intervention.  The patient's history has been reviewed, patient examined, no change in status, stable for surgery.  I have reviewed the patient's chart and labs.  Questions were answered to the patient's satisfaction.     Gray

## 2021-10-08 NOTE — Transfer of Care (Signed)
Immediate Anesthesia Transfer of Care Note  Patient: Johnathan Massey  Procedure(s) Performed: CYSTOSCOPY/URETEROSCOPY/HOLMIUM LASER/STENT PLACEMENT (Left: Ureter)  Patient Location: PACU  Anesthesia Type:General  Level of Consciousness: awake, drowsy and patient cooperative  Airway & Oxygen Therapy: Patient Spontanous Breathing and Patient connected to face mask oxygen  Post-op Assessment: Report given to RN and Post -op Vital signs reviewed and stable  Post vital signs: Reviewed and stable  Last Vitals:  Vitals Value Taken Time  BP 122/94 10/08/21 1639  Temp 35.3 C 10/08/21 1639  Pulse 79 10/08/21 1642  Resp 22 10/08/21 1642  SpO2 100 % 10/08/21 1642  Vitals shown include unvalidated device data.  Last Pain:  Vitals:   10/08/21 1639  TempSrc:   PainSc: Asleep         Complications: No notable events documented.

## 2021-10-08 NOTE — Anesthesia Procedure Notes (Signed)
Procedure Name: Intubation Date/Time: 10/08/2021 2:39 PM  Performed by: Lowry Bowl, CRNAPre-anesthesia Checklist: Patient identified, Emergency Drugs available, Suction available and Patient being monitored Patient Re-evaluated:Patient Re-evaluated prior to induction Oxygen Delivery Method: Circle system utilized Preoxygenation: Pre-oxygenation with 100% oxygen Induction Type: IV induction Ventilation: Mask ventilation without difficulty Laryngoscope Size: McGraph and 4 Grade View: Grade I Tube type: Oral Tube size: 7.0 mm Number of attempts: 1 Airway Equipment and Method: Stylet and Video-laryngoscopy Placement Confirmation: ETT inserted through vocal cords under direct vision, positive ETCO2 and breath sounds checked- equal and bilateral Secured at: 21 cm Tube secured with: Tape Dental Injury: Teeth and Oropharynx as per pre-operative assessment

## 2021-10-08 NOTE — Discharge Instructions (Addendum)
DISCHARGE INSTRUCTIONS FOR KIDNEY STONE/URETERAL STENT   MEDICATIONS:  1. Resume all your other meds from home.  2.  AZO (over-the-counter) can help with the burning/stinging when you urinate. 3.  Hydrocodone is for moderate/severe pain, Rx was sent to your pharmacy. 4.  Oxybutynin and tamsulosin or for stent/bladder irritation.  Rxs were sent to your pharmacy  ACTIVITY:  1. May resume regular activities in 24 hours. 2. No driving while on narcotic pain medications  3. Drink plenty of water  4. Continue to walk at home - you can still get blood clots when you are at home, so keep active, but don't over do it.  5. May return to work/school tomorrow or when you feel ready   SIGNS/SYMPTOMS TO CALL:  Common postoperative symptoms include urinary frequency, urgency, bladder spasm and blood in the urine  Please call us if you have a fever greater than 101.5, uncontrolled nausea/vomiting, uncontrolled pain, dizziness, unable to urinate, excessively bloody urine, chest pain, shortness of breath, leg swelling, leg pain, or any other concerns or questions.   You can reach [place] at [phone].   FOLLOW-UP:  1. You have a follow-up appointment scheduled 10/17/2021    AMBULATORY SURGERY  DISCHARGE INSTRUCTIONS   The drugs that you were given will stay in your system until tomorrow so for the next 24 hours you should not:  Drive an automobile Make any legal decisions Drink any alcoholic beverage   You may resume regular meals tomorrow.  Today it is better to start with liquids and gradually work up to solid foods.  You may eat anything you prefer, but it is better to start with liquids, then soup and crackers, and gradually work up to solid foods.   Please notify your doctor immediately if you have any unusual bleeding, trouble breathing, redness and pain at the surgery site, drainage, fever, or pain not relieved by medication.    Additional Instructions:   Please contact your  physician with any problems or Same Day Surgery at [phone], Monday through Friday 6 am to 4 pm, or [place] at [facility] number at [phone].

## 2021-10-08 NOTE — Anesthesia Preprocedure Evaluation (Addendum)
Anesthesia Evaluation  Patient identified by MRN, date of birth, ID band Patient awake    Reviewed: Allergy & Precautions, NPO status , Patient's Chart, lab work & pertinent test results  History of Anesthesia Complications Negative for: history of anesthetic complications  Airway Mallampati: II   Neck ROM: Full    Dental no notable dental hx.    Pulmonary neg pulmonary ROS,    Pulmonary exam normal breath sounds clear to auscultation       Cardiovascular hypertension, Normal cardiovascular exam Rhythm:Regular Rate:Normal  ECG 01/14/21: NSR; inferior infarct, age undetermined   Neuro/Psych negative neurological ROS     GI/Hepatic GERD  ,  Endo/Other  negative endocrine ROS  Renal/GU Renal disease (nephrolithiasis)     Musculoskeletal   Abdominal   Peds  Hematology negative hematology ROS (+)   Anesthesia Other Findings   Reproductive/Obstetrics                            Anesthesia Physical Anesthesia Plan  ASA: 2  Anesthesia Plan: General   Post-op Pain Management:    Induction: Intravenous  PONV Risk Score and Plan: 2 and Ondansetron, Dexamethasone and Treatment may vary due to age or medical condition  Airway Management Planned: Oral ETT  Additional Equipment:   Intra-op Plan:   Post-operative Plan: Extubation in OR  Informed Consent: I have reviewed the patients History and Physical, chart, labs and discussed the procedure including the risks, benefits and alternatives for the proposed anesthesia with the patient or authorized representative who has indicated his/her understanding and acceptance.     Dental advisory given and Interpreter used for interveiw  Plan Discussed with: CRNA  Anesthesia Plan Comments: (Patient consented for risks of anesthesia including but not limited to:  - adverse reactions to medications - damage to eyes, teeth, lips or other oral  mucosa - nerve damage due to positioning  - sore throat or hoarseness - damage to heart, brain, nerves, lungs, other parts of body or loss of life  Informed patient about role of CRNA in peri- and intra-operative care.  Patient voiced understanding.)      Anesthesia Quick Evaluation

## 2021-10-09 ENCOUNTER — Encounter: Payer: Self-pay | Admitting: Urology

## 2021-10-15 LAB — CALCULI, WITH PHOTOGRAPH (CLINICAL LAB)
Calcium Oxalate Dihydrate: 40 %
Calcium Oxalate Monohydrate: 50 %
Hydroxyapatite: 10 %
Weight Calculi: 7 mg

## 2021-10-16 ENCOUNTER — Other Ambulatory Visit: Payer: Self-pay | Admitting: *Deleted

## 2021-10-16 ENCOUNTER — Other Ambulatory Visit (HOSPITAL_COMMUNITY): Payer: Self-pay | Admitting: Student

## 2021-10-16 ENCOUNTER — Other Ambulatory Visit: Payer: Self-pay | Admitting: Student

## 2021-10-16 ENCOUNTER — Other Ambulatory Visit: Payer: Self-pay | Admitting: Neurosurgery

## 2021-10-16 DIAGNOSIS — M542 Cervicalgia: Secondary | ICD-10-CM

## 2021-10-16 DIAGNOSIS — N200 Calculus of kidney: Secondary | ICD-10-CM

## 2021-10-16 DIAGNOSIS — M5481 Occipital neuralgia: Secondary | ICD-10-CM

## 2021-10-17 ENCOUNTER — Ambulatory Visit
Admission: RE | Admit: 2021-10-17 | Discharge: 2021-10-17 | Disposition: A | Discharge status: Home / Self Care | Payer: 59 | Source: Ambulatory Visit | Attending: Urology | Admitting: Urology

## 2021-10-17 ENCOUNTER — Ambulatory Visit
Admission: RE | Admit: 2021-10-17 | Discharge: 2021-10-17 | Disposition: A | Discharge status: Home / Self Care | Payer: 59 | Attending: Urology | Admitting: Urology

## 2021-10-17 ENCOUNTER — Encounter: Payer: Self-pay | Admitting: Urology

## 2021-10-17 ENCOUNTER — Ambulatory Visit (INDEPENDENT_AMBULATORY_CARE_PROVIDER_SITE_OTHER): Payer: 59 | Admitting: Urology

## 2021-10-17 VITALS — BP 137/98 | HR 82 | Ht 69.0 in | Wt 200.0 lb

## 2021-10-17 DIAGNOSIS — N200 Calculus of kidney: Secondary | ICD-10-CM | POA: Diagnosis not present

## 2021-10-17 DIAGNOSIS — Z87442 Personal history of urinary calculi: Secondary | ICD-10-CM | POA: Diagnosis not present

## 2021-10-17 DIAGNOSIS — Z466 Encounter for fitting and adjustment of urinary device: Secondary | ICD-10-CM | POA: Diagnosis not present

## 2021-10-17 LAB — URINALYSIS, COMPLETE
Bilirubin, UA: NEGATIVE
Glucose, UA: NEGATIVE
Ketones, UA: NEGATIVE
Nitrite, UA: NEGATIVE
Specific Gravity, UA: 1.01 (ref 1.005–1.030)
Urobilinogen, Ur: 0.2 mg/dL (ref 0.2–1.0)
pH, UA: 6 (ref 5.0–7.5)

## 2021-10-17 LAB — MICROSCOPIC EXAMINATION: RBC, Urine: >30 /hpf — AB (ref 0–2)

## 2021-10-17 NOTE — Patient Instructions (Signed)

## 2021-10-17 NOTE — Progress Notes (Signed)
Indications: Patient is 46 y.o., who is s/p ureteroscopic removal of a 10 mm left upper pole stone and partial removal of a 19 mm nonobstructing left lower pole calculus 10/08/2021.  The large lower pole calculus branched into 2 separate infundibula in the lateral aspect could not be treated due to inability to access with the ureteroscope.  KUB today with proximal 6 mm remaining lower pole calculus and 90 lower pole fragments.  He had no postoperative problems the patient is presenting today for stent removal.  Procedure:  Flexible Cystoscopy with stent removal (76546)  Timeout was performed and the correct patient, procedure and participants were identified.    Description:  The patient was prepped and draped in the usual sterile fashion. Flexible cystosopy was performed.  The stent was visualized, grasped, and removed intact without difficulty. The patient tolerated the procedure well.  A single dose of oral antibiotics was given.  Complications:  None  Plan: Instructed call for fever or flank pain post stent removal Proceeding with a metabolic evaluation to include 24-hour urine study and blood work [place] analysis: CaOxMono/CaOxDi/hydroxyapatite 50/40/10    [name], MD

## 2021-10-25 ENCOUNTER — Ambulatory Visit: Payer: 59

## 2021-11-13 ENCOUNTER — Other Ambulatory Visit: Payer: Self-pay | Admitting: Student

## 2021-11-13 DIAGNOSIS — M542 Cervicalgia: Secondary | ICD-10-CM

## 2021-11-13 DIAGNOSIS — M5481 Occipital neuralgia: Secondary | ICD-10-CM

## 2021-11-14 ENCOUNTER — Other Ambulatory Visit: Payer: Self-pay | Admitting: Student

## 2021-11-14 DIAGNOSIS — M542 Cervicalgia: Secondary | ICD-10-CM

## 2021-11-14 DIAGNOSIS — M5481 Occipital neuralgia: Secondary | ICD-10-CM

## 2021-11-28 ENCOUNTER — Ambulatory Visit
Admission: RE | Admit: 2021-11-28 | Discharge: 2021-11-28 | Disposition: A | Discharge status: Home / Self Care | Payer: 59 | Source: Ambulatory Visit | Attending: Student | Admitting: Student

## 2021-11-28 ENCOUNTER — Other Ambulatory Visit: Payer: 59

## 2021-11-28 DIAGNOSIS — M5481 Occipital neuralgia: Secondary | ICD-10-CM

## 2021-11-28 DIAGNOSIS — M542 Cervicalgia: Secondary | ICD-10-CM

## 2021-11-28 MED ORDER — GADOBENATE DIMEGLUMINE 529 MG/ML IV SOLN
17.0000 mL | Freq: Once | INTRAVENOUS | Status: AC | PRN
  Administered 2021-11-28: 17 mL via INTRAVENOUS

## 2022-04-18 ENCOUNTER — Ambulatory Visit: Payer: 59 | Admitting: Urology

## 2022-10-20 IMAGING — CT CT RENAL STONE PROTOCOL
2 of 4 series · 16 of 46 positions shown, 18 images · non-contrast
Comparison: None.

CLINICAL DATA: Flank pain.  Concern for kidney stone.

EXAM:
CT ABDOMEN AND PELVIS WITHOUT CONTRAST
TECHNIQUE: Multidetector CT imaging of the abdomen and pelvis was performed
following the standard protocol without IV contrast.

[Series 2: stone full standard · axial · 0.82mm/px · z∈[-474,-34]mm · 13 of 98 slices shown, 15 images]
[im 5/98  soft-tissue]
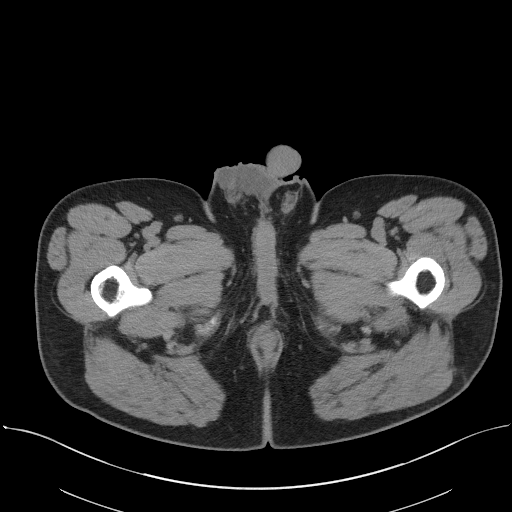
[im 5/98  bone]
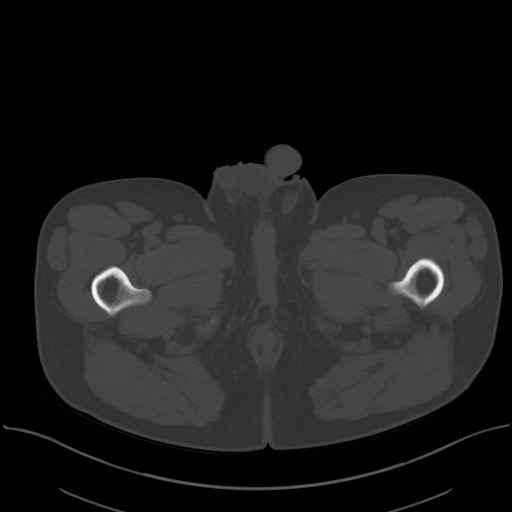
[im 13/98  soft-tissue]
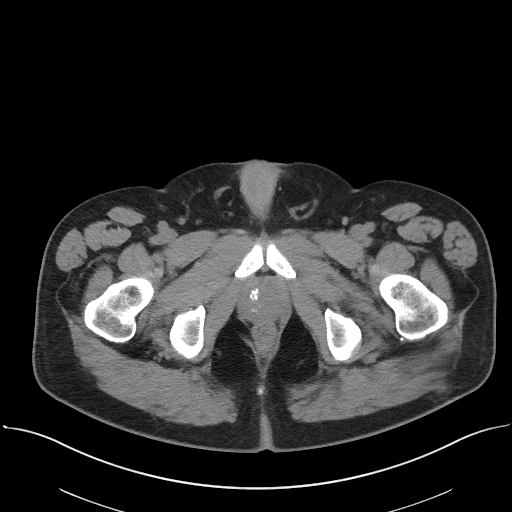
[im 22/98  soft-tissue]
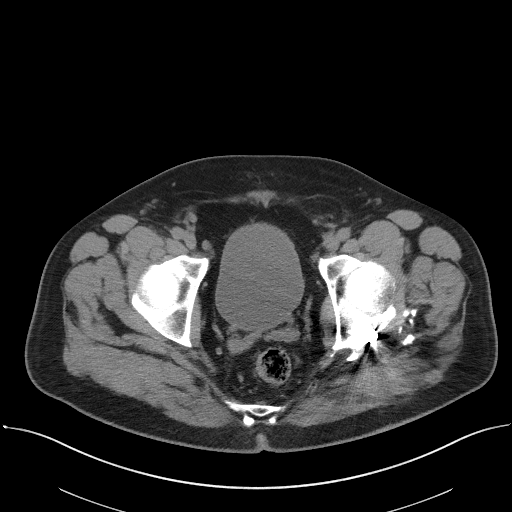
[im 26/98  soft-tissue]
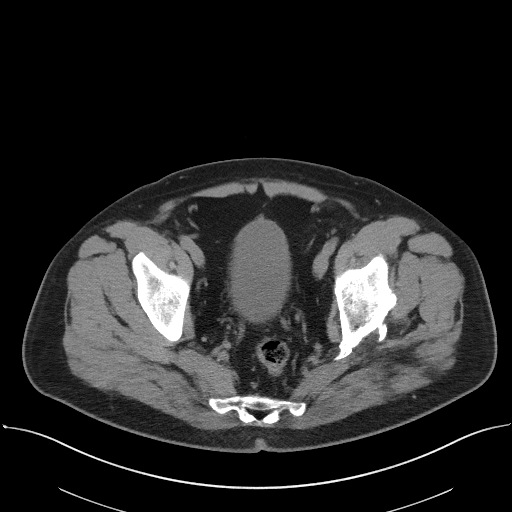
[im 34/98  soft-tissue]
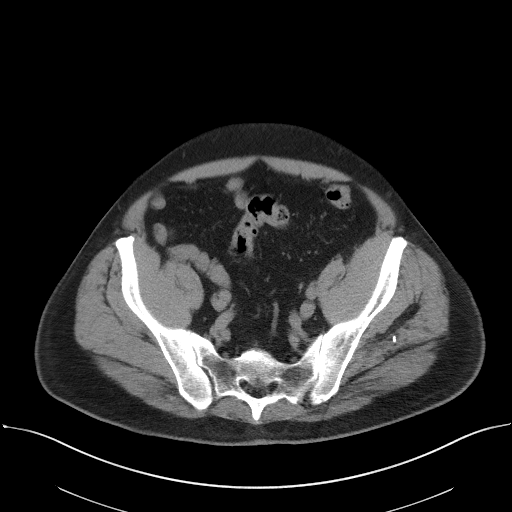
[im 43/98  soft-tissue]
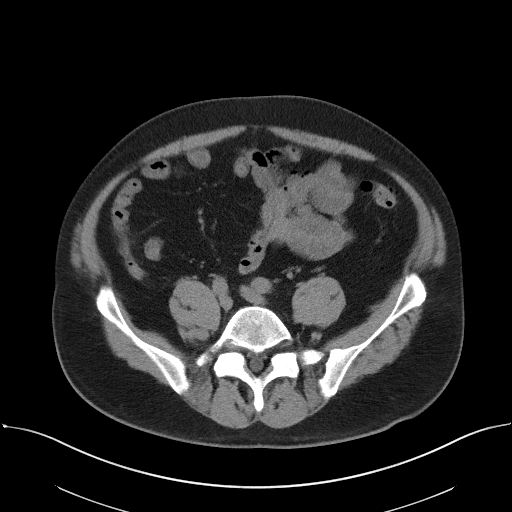
[im 51/98  soft-tissue]
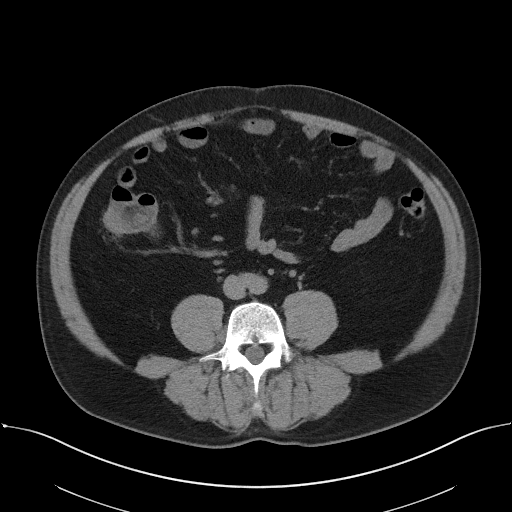
[im 55/98  soft-tissue]
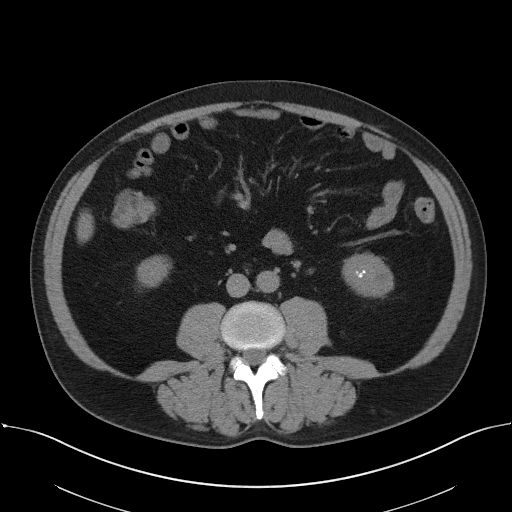
[im 64/98  soft-tissue]
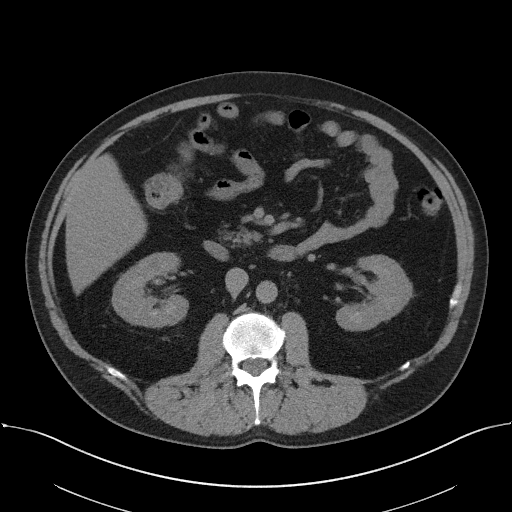
[im 64/98  bone]
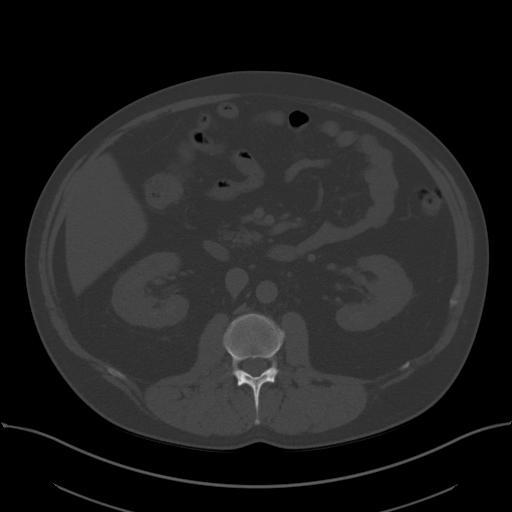
[im 72/98  soft-tissue]
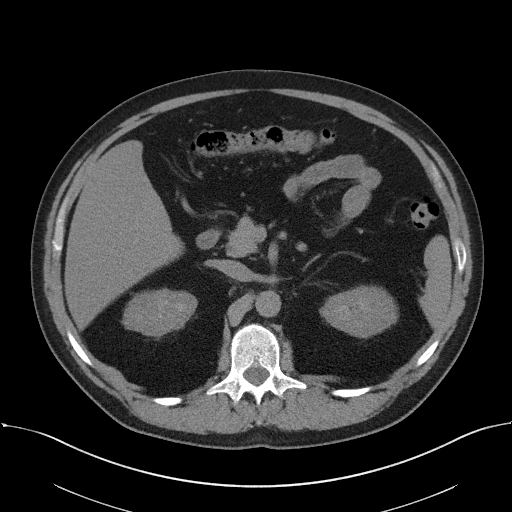
[im 76/98  soft-tissue]
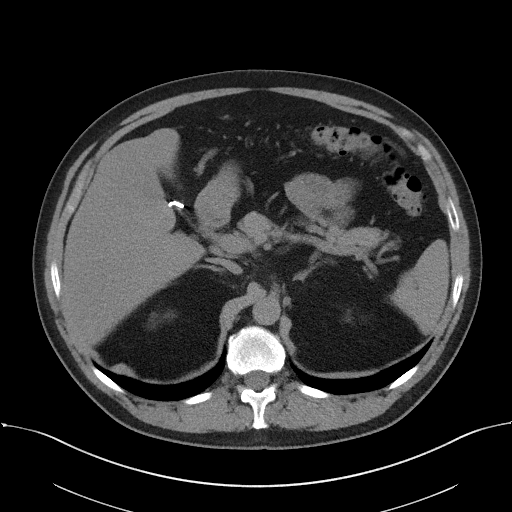
[im 85/98  soft-tissue]
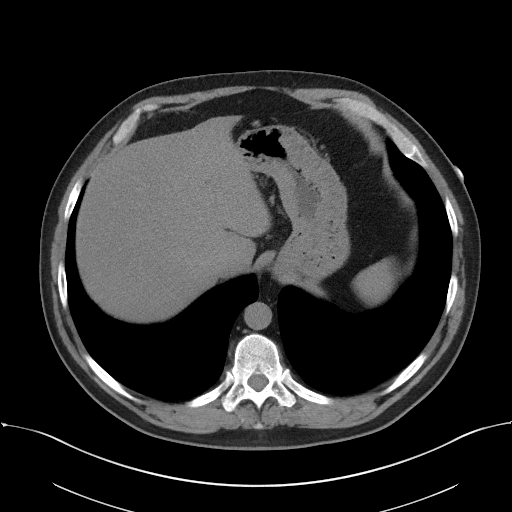
[im 93/98  soft-tissue]
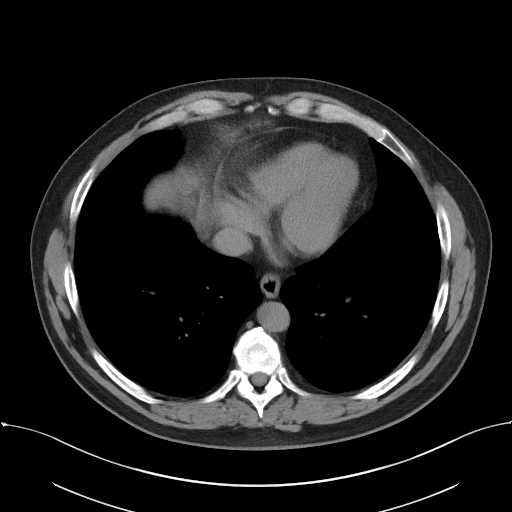

[Series 5: coronal · coronal · 0.78mm/px · 3 of 155 slices shown]
[im 52/155  soft-tissue]
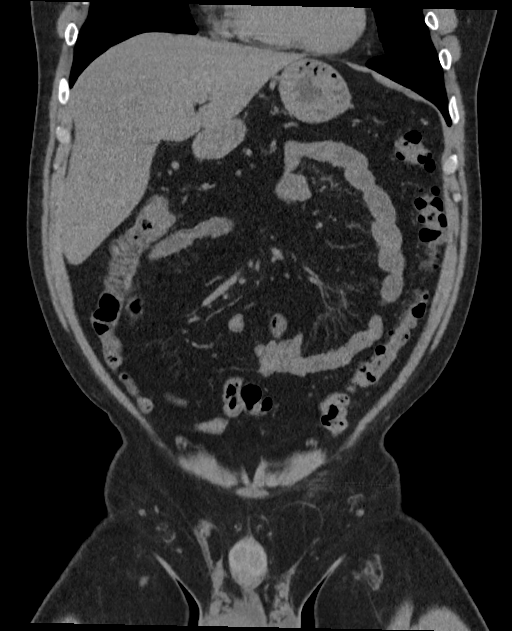
[im 69/155  soft-tissue]
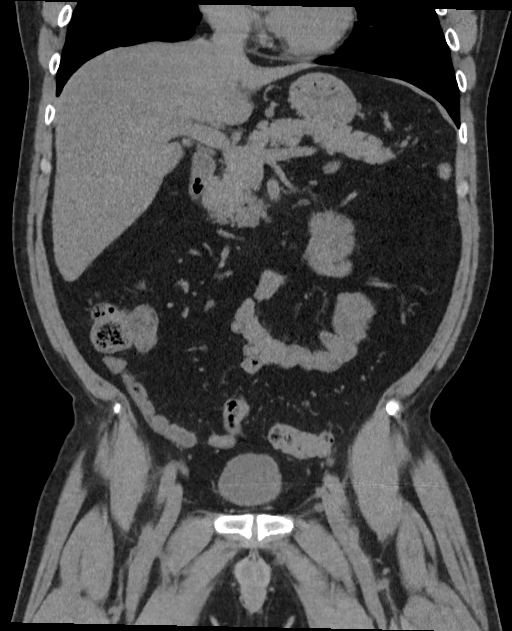
[im 86/155  soft-tissue]
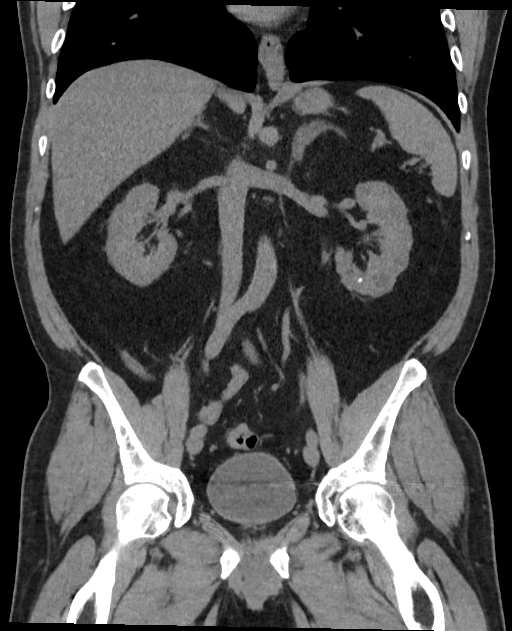

[16 of 46 positions shown; findings below may reference images not displayed]

FINDINGS: Evaluation of this exam is limited in the absence of intravenous
contrast.

Lower chest: The visualized lung bases are clear.

No intra-abdominal free air or free fluid.

Hepatobiliary: Fatty liver. No intrahepatic biliary dilatation.
Cholecystectomy.

Pancreas: Unremarkable. No pancreatic ductal dilatation or
surrounding inflammatory changes.

Spleen: Normal in size without focal abnormality.

Adrenals/Urinary Tract: The adrenal glands unremarkable. Several
nonobstructing left renal calculi measure up to 17 mm in the
inferior pole of the left kidney. No hydronephrosis. There is no
hydronephrosis or nephrolithiasis on the right. The right ureter and
the urinary bladder appear unremarkable.

Stomach/Bowel: There is diffuse colonic diverticulosis without
active inflammatory changes. There is no bowel obstruction or active
inflammation. The appendix is unremarkable.

Vascular/Lymphatic: The abdominal aorta and IVC unremarkable. No
portal venous gas. There is no adenopathy.

Reproductive: The prostate and seminal vesicles are grossly
unremarkable. No pelvic mass.

Other: Partially visualized small bilateral hydroceles.

Musculoskeletal: Left pelvic bone fixation screws. No acute osseous
pathology.
IMPRESSION: 1. Nonobstructing left renal calculi. No hydronephrosis or
obstructing stone.
2. Colonic diverticulosis. No bowel obstruction. Normal appendix.
3. Fatty liver.

## 2023-10-22 ENCOUNTER — Other Ambulatory Visit: Payer: Self-pay | Admitting: Internal Medicine

## 2023-10-22 DIAGNOSIS — I10 Essential (primary) hypertension: Secondary | ICD-10-CM

## 2023-10-22 DIAGNOSIS — R0602 Shortness of breath: Secondary | ICD-10-CM

## 2023-11-03 ENCOUNTER — Other Ambulatory Visit

## 2023-11-11 ENCOUNTER — Ambulatory Visit
Admission: RE | Admit: 2023-11-11 | Discharge: 2023-11-11 | Disposition: A | Discharge status: Home / Self Care | Payer: Self-pay | Source: Ambulatory Visit | Attending: Internal Medicine | Admitting: Internal Medicine

## 2023-11-11 DIAGNOSIS — R0602 Shortness of breath: Secondary | ICD-10-CM | POA: Insufficient documentation

## 2023-11-11 DIAGNOSIS — I10 Essential (primary) hypertension: Secondary | ICD-10-CM | POA: Insufficient documentation
